# Patient Record
Sex: Male | Born: 1995 | Race: White | Hispanic: No | Marital: Single | Smoking: Never smoker
Health system: Southern US, Academic
[De-identification: ages and names within clinical notes are randomized; demographics above are authoritative.]

---
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
## 2018-09-08 ENCOUNTER — Other Ambulatory Visit: Payer: Self-pay

## 2018-09-08 DIAGNOSIS — Z20822 Contact with and (suspected) exposure to covid-19: Secondary | ICD-10-CM

## 2018-09-10 LAB — NOVEL CORONAVIRUS, NAA: SARS-CoV-2, NAA: NOT DETECTED

## 2018-09-21 ENCOUNTER — Other Ambulatory Visit: Payer: Self-pay

## 2018-09-21 DIAGNOSIS — Z20822 Contact with and (suspected) exposure to covid-19: Secondary | ICD-10-CM

## 2018-09-22 LAB — NOVEL CORONAVIRUS, NAA: SARS-CoV-2, NAA: DETECTED — AB

## 2018-09-23 ENCOUNTER — Other Ambulatory Visit: Payer: Self-pay

## 2018-09-23 DIAGNOSIS — Z20822 Contact with and (suspected) exposure to covid-19: Secondary | ICD-10-CM

## 2018-09-24 LAB — NOVEL CORONAVIRUS, NAA: SARS-CoV-2, NAA: NOT DETECTED

## 2018-09-25 ENCOUNTER — Telehealth: Payer: Self-pay

## 2018-09-25 NOTE — Telephone Encounter (Signed)
Patient was returning call for test result. He is aware that his most recent COVID-19 test was negative on 09/23/2018. He was positive 09/21/2018 but that he had been quarantined because of exposure for 14 days prior to most recent test. He states he has heard from the HD and has been released from quarantine.

## 2020-10-17 ENCOUNTER — Ambulatory Visit: Payer: No Typology Code available for payment source | Attending: NURSE PRACTITIONER | Admitting: NURSE PRACTITIONER

## 2020-10-17 ENCOUNTER — Encounter (INDEPENDENT_AMBULATORY_CARE_PROVIDER_SITE_OTHER): Payer: Self-pay | Admitting: NURSE PRACTITIONER

## 2020-10-17 ENCOUNTER — Ambulatory Visit (INDEPENDENT_AMBULATORY_CARE_PROVIDER_SITE_OTHER): Payer: No Typology Code available for payment source | Admitting: NURSE PRACTITIONER

## 2020-10-17 ENCOUNTER — Other Ambulatory Visit: Payer: Self-pay

## 2020-10-17 VITALS — BP 116/68 | Temp 98.9°F | Ht 70.0 in | Wt 142.6 lb

## 2020-10-17 DIAGNOSIS — R519 Headache, unspecified: Secondary | ICD-10-CM

## 2020-10-17 DIAGNOSIS — U071 COVID-19: Secondary | ICD-10-CM

## 2020-10-17 DIAGNOSIS — R112 Nausea with vomiting, unspecified: Secondary | ICD-10-CM

## 2020-10-17 LAB — POC COVID-19, FLU A/B, RSV RAPID BY PCR (RESULTS)
INFLUENZA VIRUS A, PCR 4PLEX, POC: NEGATIVE
INFLUENZA VIRUS B, PCR 4PLEX, POC: NEGATIVE
RSV, PCR 4PLEX, POC: NEGATIVE
SARS-COV-2, POC: POSITIVE — AB

## 2020-10-17 MED ORDER — NIRMATRELVIR 300 MG (150 MG X2)-RITONAVIR 100 MG TABLET,DOSE PACK
3.0000 | ORAL_TABLET | Freq: Two times a day (BID) | ORAL | 0 refills | Status: DC
Start: 2020-10-17 — End: 2020-10-18

## 2020-10-17 NOTE — Nursing Note (Signed)
Pt has stated his neck has been bothering him since 4 am  Pt has had a fever and a small cough, pt has congestion and has been nauseas and vomiting.   Pt took a home COVID test and was negative.

## 2020-10-17 NOTE — Result Encounter Note (Signed)
I called and notified the pt of his results and his quarantine recommendations. He states that he is not sure if he is interested in an antiviral or not. He states that he will call back in if interested.   Riki Altes, RTR 10/17/2020 17:49

## 2020-10-17 NOTE — Addendum Note (Signed)
Addended by: York Pellant on: 10/17/2020 06:16 PM     Modules accepted: Orders

## 2020-10-17 NOTE — Result Encounter Note (Signed)
Please call patient with results. Please ask patient if he is interested in antiviral medication.

## 2020-10-17 NOTE — Progress Notes (Signed)
[address], [facility]  [address] [address]  [facility] [place] [address]  [facility]          History of Present Illness: Jared Charles is a 25 y.o. male who presents to the Urgent Care today with chief complaint of Nausea and Fever     Patient presents with neck stiffness, body aches, sore throat, congestion, diaphoresis, headache, nausea and vomiting since this morning. He has taken an at home COVID test and was negative. Patient is concerned because he had meningitis in the past.     The history was provided by the patient   I reviewed and confirmed the patient's past medical history taken by the nurse or medical assistant with the addition of the following:    History reviewed. No pertinent past medical history.       History reviewed. No past surgical history pertinent negatives.         No Known Allergies     No current outpatient medications     Social History     Socioeconomic History   . Marital status: Single   Tobacco Use   . Smoking status: Never   . Smokeless tobacco: Never   Vaping Use   . Vaping Use: Some days        Family Medical History:     Problem Relation (Age of Onset)    No Known Problems Mother, Father             Review of Systems   Constitutional: Positive for chills and diaphoresis. Negative for fatigue and fever.   HENT: Positive for congestion and sore throat.    Respiratory: Positive for shortness of breath.    Cardiovascular: Negative for chest pain.   Gastrointestinal: Positive for nausea and vomiting.   Musculoskeletal: Positive for arthralgias and myalgias.   Skin: Negative.  Negative for rash.   Neurological: Positive for headaches.   All other systems reviewed and are negative.      Vital signs:   Vitals:    10/17/20 1641   BP: 116/68   Temp: 37.2 C (98.9 F)   Weight: 64.7 kg (142 lb 9.6 oz)   Height: 1.778 m (5\' 10" )   BMI: 20.5         Body mass index is 20.46 kg/m.   Facility age limit for growth percentiles is 20 years.  No LMP for male  patient.    Physical Exam  Vitals reviewed.   HENT:      Head: Normocephalic.      Right Ear: External ear normal.      Left Ear: External ear normal.      Nose: Congestion present.      Mouth/Throat:      Mouth: Mucous membranes are moist.   Eyes:      Extraocular Movements: Extraocular movements intact.   Cardiovascular:      Rate and Rhythm: Normal rate.      Heart sounds: Normal heart sounds.   Pulmonary:      Effort: Pulmonary effort is normal.      Breath sounds: Normal breath sounds.   Abdominal:      General: Abdomen is flat.   Musculoskeletal:         General: Normal range of motion.      Cervical back: Tenderness present.   Skin:     General: Skin is warm and dry.      Capillary Refill: Capillary refill takes less than 2 seconds.  Neurological:      General: No focal deficit present.      Mental Status: He is alert and oriented to person, place, and time.   Psychiatric:         Mood and Affect: Mood normal.         Data Reviewed / POCT Results:    POCT Results:                   Lab Results   Component Value Date    SARSCOVPOC Positive (A) 10/17/2020    FLUAPOC Negative 10/17/2020    FLUBPOC Negative 10/17/2020    RSVP2POC Negative 10/17/2020         I have reviewed and confirmed the above point of care results.  Hampton Abbot, APRN 10/17/2020, 17:58      Course: Condition at discharge: Stable    Jared Charles was seen today for nausea and fever.    Diagnoses and all orders for this visit:    Headache  -     PERFORM POC COVID-19, FLU A/B, RSV RAPID BY PCR - CLINIC ONLY    Nausea and vomiting, unspecified vomiting type  -     PERFORM POC COVID-19, FLU A/B, RSV RAPID BY PCR - CLINIC ONLY    COVID-19  -     POC COVID-19, FLU A/B, RSV RAPID BY PCR (RESULTS)  -     nirmatrelvir-ritonavir 300 mg (150 mg x 2)-100 mg Oral Tablets, Dose Pack; Take 3 Tablets by mouth in the morning and 3 Tablets before bedtime. Do all this for 5 days.      *Patient verbalized understanding of the assessment and plan.  *If symptoms are  worsening or not improving the patient should return to the Urgent Care for further evaluation.  *Go to the Emergency Department immediately for further work up if any concerning symptoms develop.     APPID-Independent visit with no supervising physician on site   The offsite supervising and collaborating physician for this visit was Dr. Earlie Server, MD     Hampton Abbot, APRN 10/17/2020, 16:49

## 2020-10-17 NOTE — Nursing Note (Signed)
Results for orders placed or performed in visit on 10/17/20 (from the past 12 hour(s))   POC COVID-19, FLU A/B, RSV RAPID BY PCR (RESULTS)   Result Value Ref Range    SARS-COV-2, POC Positive (A) Negative    INFLUENZA VIRUS A, PCR 4PLEX, POC Negative Negative    INFLUENZA VIRUS B, PCR 4PLEX, POC Negative Negative    RSV, PCR 4PLEX, POC Negative Negative   Riki Altes, RTR 10/17/2020 17:46

## 2020-10-18 ENCOUNTER — Other Ambulatory Visit (INDEPENDENT_AMBULATORY_CARE_PROVIDER_SITE_OTHER): Payer: Self-pay | Admitting: NURSE PRACTITIONER

## 2020-10-18 DIAGNOSIS — U071 COVID-19: Secondary | ICD-10-CM

## 2020-10-18 MED ORDER — NIRMATRELVIR 300 MG (150 MG X2)-RITONAVIR 100 MG TABLET,DOSE PACK
3.0000 | ORAL_TABLET | Freq: Two times a day (BID) | ORAL | 0 refills | Status: AC
Start: 2020-10-18 — End: 2020-10-23

## 2023-05-27 ENCOUNTER — Encounter (INDEPENDENT_AMBULATORY_CARE_PROVIDER_SITE_OTHER): Payer: Self-pay

## 2023-12-11 ENCOUNTER — Other Ambulatory Visit: Payer: Self-pay

## 2023-12-11 ENCOUNTER — Emergency Department
Admission: EM | Admit: 2023-12-11 | Discharge: 2023-12-11 | Disposition: A | Discharge status: Home / Self Care | Attending: Emergency Medicine | Admitting: Emergency Medicine

## 2023-12-11 DIAGNOSIS — F32A Depression, unspecified: Secondary | ICD-10-CM

## 2023-12-11 LAB — BASIC METABOLIC PANEL
ANION GAP: 6 mmol/L (ref 4–13)
BUN/CREA RATIO: 12 (ref 6–22)
BUN: 11 mg/dL (ref 8–25)
CALCIUM: 9.5 mg/dL (ref 8.6–10.2)
CHLORIDE: 106 mmol/L (ref 96–111)
CO2 TOTAL: 26 mmol/L (ref 22–30)
CREATININE: 0.89 mg/dL (ref 0.75–1.35)
GLUCOSE: 92 mg/dL (ref 65–125)
POTASSIUM: 4.3 mmol/L (ref 3.5–5.1)
SODIUM: 138 mmol/L (ref 136–145)
eGFRcr - MALE: >90 mL/min/1.73mˆ2 (ref >=60)

## 2023-12-11 LAB — CBC WITH DIFF
BASOPHIL #: <0.1 x10ˆ3/uL (ref <=0.20)
BASOPHIL %: 0.7 %
EOSINOPHIL #: 0.22 x10ˆ3/uL (ref <=0.50)
EOSINOPHIL %: 3 %
HCT: 42 % (ref 38.9–52.0)
HGB: 14.5 g/dL (ref 13.4–17.5)
IMMATURE GRANULOCYTE #: <0.1 x10ˆ3/uL (ref <0.10)
IMMATURE GRANULOCYTE %: 0.3 % (ref 0.0–1.0)
LYMPHOCYTE #: 2.17 x10ˆ3/uL (ref 1.00–4.80)
LYMPHOCYTE %: 29.7 %
MCH: 29.9 pg (ref 26.0–32.0)
MCHC: 34.5 g/dL (ref 31.0–35.5)
MCV: 86.6 fL (ref 78.0–100.0)
MONOCYTE #: 0.48 x10ˆ3/uL (ref 0.20–1.10)
MONOCYTE %: 6.6 %
MPV: 9.7 fL (ref 8.7–12.5)
NEUTROPHIL #: 4.36 x10ˆ3/uL (ref 1.50–7.70)
NEUTROPHIL %: 59.7 %
PLATELETS: 358 x10ˆ3/uL (ref 150–400)
RBC: 4.85 x10ˆ6/uL (ref 4.50–6.10)
RDW-CV: 12.1 % (ref 11.5–15.5)
WBC: 7.3 x10ˆ3/uL (ref 3.7–11.0)

## 2023-12-11 LAB — ETHANOL, SERUM/PLASMA
ETHANOL: <10 mg/dL (ref <10)
ETHANOL: NOT DETECTED

## 2023-12-11 NOTE — ED Triage Notes (Addendum)
 [facility] - Emergency Department   Physician/APP in Triage Note  Medical Screening Exam     Date and Time of Assessment: 12/11/2023 15:12     Chief Complaint   Patient presents with    Depression     Patient reports increased depression over last 2 months. Patient was recently married. Denies SI/HI at this time. He states he came so he could get help before he became SI.      Brief HPI:  Gradually worsening depression for the last 2 months with no SI or HI but significantly decreased activity level, "lying in bed all day every day. "  Feels like he is at a breaking point.  History of depression in the past.  No current medications.  No prior visits for the same complaint.    Focused Physical Exam:   ED Triage Vitals [12/11/23 1417]   BP (Non-Invasive) (!) 133/92   Heart Rate 55   Respiratory Rate 18   Temperature 37.1 C (98.8 F)   SpO2 99 %   Weight 69.7 kg (153 lb 10.6 oz)   Height 1.778 m (5' 10)     Well-developed well-nourished male sitting in chair.  No acute distress.  Good eye contact.  Normal affect.    Preliminary Plan:  Labs ordered  EKG ordered  Imaging ordered  Patient will return to waiting room    Course & MDM:  ED Course as of 12/11/23 1704   Thu Dec 11, 2023   1615 Paged psychiatry     [phone] Patient seen by psychiatry, cleared for discharge. Plan for close outpatient follow-up.         ED Disposition:  Discharged    Results:  Labs Ordered/Reviewed   BASIC METABOLIC PANEL - Normal   ETHANOL, SERUM/PLASMA   CBC/DIFF    Narrative:     The following orders were created for panel order CBC/DIFF.  Procedure                               Abnormality         Status                     ---------                               -----------         ------                     CBC WITH IPQQ[[id number]]                                    Final result                 Please view results for these tests on the individual orders.   CBC WITH DIFF   URINALYSIS, MACROSCOPIC AND MICROSCOPIC W/CULTURE  REFLEX    Narrative:     The following orders were created for panel order URINALYSIS, MACROSCOPIC AND MICROSCOPIC W/CULTURE REFLEX.  Procedure                               Abnormality         Status                     ---------                               -----------         ------  URINALYSIS, MACROSCOPIC[[id number]]                                                     URINALYSIS, MICROSCOPIC[[id number]]                                                       Please view results for these tests on the individual orders.   DRUG SCREEN, NO CONFIRMATION, URINE   URINALYSIS, MACROSCOPIC   URINALYSIS, MICROSCOPIC   EXTRA TUBES    Narrative:     The following orders were created for panel order EXTRA TUBES.  Procedure                               Abnormality         Status                     ---------                               -----------         ------                     RED TOP ULAZ[[id number]]                                     In process                   Please view results for these tests on the individual orders.   RED TOP TUBE     No orders to display      Patient evaluated upon arrival to the Emergency Department.  Differential diagnosis considered including: depression, electrolyte abnormality.  Diagnostic evaluation reviewed.  No acute process on serum testing. Consult with psychiatry, plan for outpatient care and close follow-up. Reviewed with patient, who understands the plan and is in agreement at this time.     Discharged    Clinical Impression   Depression, unspecified depression type (Primary)

## 2023-12-11 NOTE — Ancillary Notes (Signed)
 [facility]  Psychiatry Interval Note        Jared Charles  Date of service: 12/11/2023    Interval update  - Reports recent decompensation of depression, anxiety  - Patient denying acute lethality concerns at this time  - Clearly states he would return to ED if he were to become suicidal  - Reports desire to initiate psychotropics, has trialed both Lexapro / Zoloft > 10 years ago. Reported positive effect from Lexapro but cannot recall why he was switched to Zoloft (did follow with an OP psychiatrist at that time)  - Reports cannabis use, encouraged abstinence given worsening of mood / anxiety sx with persistent daily use   - Discussed arrangement of access f/u via casemanagers, patient agreeable    Plan:  - Reached out to [facility] about arrangement of f/u into resident access clinic for initiation of psychotropics, possibly referral to psychotherapy  - Deferred initiation of psychotropics at this time    [name], MD  12/11/2023 16:43  [employer], Resident, PGY-1  Department of Behavioral Medicine & Psychiatry  [facility]   -  I was immediately available for consultation. I reviewed the resident's note. I agree with the findings and plan of care as documented in the resident's note. Any exceptions/additions are edited/noted.     Arrangement of outpatient psychiatric care requested by the ED. No acute psychiatric safety concerns endorsed and no formal consult requested. Psychiatry briefly spoke with patient as noted and arranged followup. Cleared for discharge by ED team.    Donal Kicks, MD (he/him)  12/12/2023, 07:58  Assistant Professor, Behavioral Medicine & Psychiatry

## 2023-12-11 NOTE — Discharge Instructions (Signed)
 Thank you for letting us  care for you today. Please follow up with your psychiatrist and a local PCP and return to the ED for high fevers, severe pain, nausea/vomiting not controlled at home, or other concerning symptoms.

## 2023-12-12 ENCOUNTER — Telehealth (HOSPITAL_COMMUNITY): Payer: Self-pay

## 2023-12-12 ENCOUNTER — Encounter (HOSPITAL_COMMUNITY): Payer: Self-pay

## 2023-12-12 NOTE — Telephone Encounter (Signed)
-----   Message from [name], MD sent at 12/11/2023  4:46 PM EST -----  Regarding: Access F/u  Hi Stephanie!    I wanted to reach out about the attached patient. He presented to the ED with recent worsening of depression, anxiety. He had no acute lethality concerns but has had some decline in ADLs. He was not appropriate for inpatient admission. He and his spouse were very interested in pursuing psychiatric f/u via scheduling with our residents via access clinic. Would you be willing to help me get them scheduled for f/u possibly Friday (12/5) or early next week?    If this request is more appropriate for someone else I would be happy to forward it to them, just let me know.    Thanks!    [name]   PGY-1

## 2023-12-16 ENCOUNTER — Ambulatory Visit (HOSPITAL_COMMUNITY): Admitting: Student in an Organized Health Care Education/Training Program
